# Patient Record
Sex: Male | Born: 1970 | Race: White | Hispanic: No | Marital: Married | Smoking: Never smoker
Health system: Southern US, Community
[De-identification: ages and names within clinical notes are randomized; demographics above are authoritative.]

## PROBLEM LIST (undated history)

## (undated) DIAGNOSIS — Z148 Genetic carrier of other disease: Secondary | ICD-10-CM

## (undated) DIAGNOSIS — D447 Neoplasm of uncertain behavior of aortic body and other paraganglia: Secondary | ICD-10-CM

## (undated) HISTORY — DX: Genetic carrier of other disease: Z14.8

## (undated) HISTORY — DX: Neoplasm of uncertain behavior of aortic body and other paraganglia: D44.7

---

## 2016-03-22 ENCOUNTER — Other Ambulatory Visit: Payer: Self-pay | Admitting: Internal Medicine

## 2016-03-22 DIAGNOSIS — E784 Other hyperlipidemia: Secondary | ICD-10-CM | POA: Diagnosis not present

## 2016-03-22 DIAGNOSIS — D447 Neoplasm of uncertain behavior of aortic body and other paraganglia: Secondary | ICD-10-CM

## 2016-03-22 DIAGNOSIS — Z6824 Body mass index (BMI) 24.0-24.9, adult: Secondary | ICD-10-CM | POA: Diagnosis not present

## 2016-03-24 ENCOUNTER — Telehealth: Payer: Self-pay | Admitting: Genetic Counselor

## 2016-03-24 ENCOUNTER — Encounter: Payer: Self-pay | Admitting: Genetic Counselor

## 2016-03-24 NOTE — Telephone Encounter (Signed)
Verified address and insurance, faxed referring provider, mailed new pt packet, completed intake, pt confirmed appt

## 2016-03-28 ENCOUNTER — Ambulatory Visit
Admission: RE | Admit: 2016-03-28 | Discharge: 2016-03-28 | Disposition: A | Discharge status: Home / Self Care | Payer: BLUE CROSS/BLUE SHIELD | Source: Ambulatory Visit | Attending: Internal Medicine | Admitting: Internal Medicine

## 2016-03-28 DIAGNOSIS — D447 Neoplasm of uncertain behavior of aortic body and other paraganglia: Secondary | ICD-10-CM

## 2016-03-28 DIAGNOSIS — G9389 Other specified disorders of brain: Secondary | ICD-10-CM | POA: Diagnosis not present

## 2016-03-28 DIAGNOSIS — R938 Abnormal findings on diagnostic imaging of other specified body structures: Secondary | ICD-10-CM | POA: Diagnosis not present

## 2016-03-28 MED ORDER — IOPAMIDOL (ISOVUE-300) INJECTION 61%
75.0000 mL | Freq: Once | INTRAVENOUS | Status: AC | PRN
  Administered 2016-03-28: 75 mL via INTRAVENOUS

## 2016-05-05 ENCOUNTER — Telehealth: Payer: Self-pay | Admitting: Genetic Counselor

## 2016-05-05 NOTE — Telephone Encounter (Signed)
Contacted referring office to refax the referral packet for records regarding appt for 06/05

## 2016-05-08 ENCOUNTER — Encounter: Payer: Self-pay | Admitting: Genetic Counselor

## 2016-05-08 ENCOUNTER — Other Ambulatory Visit: Payer: BLUE CROSS/BLUE SHIELD

## 2016-05-08 ENCOUNTER — Ambulatory Visit (HOSPITAL_BASED_OUTPATIENT_CLINIC_OR_DEPARTMENT_OTHER): Payer: BLUE CROSS/BLUE SHIELD | Admitting: Genetic Counselor

## 2016-05-08 DIAGNOSIS — D447 Neoplasm of uncertain behavior of aortic body and other paraganglia: Secondary | ICD-10-CM

## 2016-05-08 DIAGNOSIS — Z315 Encounter for genetic counseling: Secondary | ICD-10-CM

## 2016-05-08 DIAGNOSIS — Z148 Genetic carrier of other disease: Secondary | ICD-10-CM | POA: Diagnosis not present

## 2016-05-08 NOTE — Progress Notes (Signed)
REFERRING PROVIDER: Velna Hatchet, MD [place], [place] [address]  PRIMARY PROVIDER:  No primary care provider on file.  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 male, was seen for a [facility] cancer genetics consultation at the request of Dr. Ardeth Pruitt due to a personal and family history of cancer.  Mr. John Pruitt presents to clinic today to discuss the possibility of a hereditary predisposition to cancer, genetic testing, and to further clarify his future cancer risks, as well as potential cancer risks for family members.   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  This was treated with surgery in [place], [place], and his hearing did not recover. Within weeks to months another lump came up near the site of incision.  This was thought to be scar tissue. 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  It is unclear if this was a new PGL or if it was a remnant of the original PGL.  Mr. John Pruitt recently had a CT scan to look for additional PGLs and it was negative.   CANCER HISTORY:   No history exists.     RISK FACTORS:  Colonoscopy: yes; cannot remember why he had a colonoscopy but it was clear, no polyps. Annual physicals, unclear if he has had prostate cancer screening Non-smoker Alcohol use: 15-20 beers/week  Past Medical History  Diagnosis Date  . 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 Hemochromatosis carrier     History reviewed. No pertinent past surgical history.  Social History   Social History  . Marital Status: Married    Spouse Name: John Pruitt  . Number of Children: 2  . Years of Education: N/A   Social History Main Topics  . Smoking status: Never Smoker   . Smokeless tobacco: None  . Alcohol Use: Yes      Comment: 15-20 beers/week  . Drug Use: None  . Sexual Activity: Not Asked   Other Topics Concern  . None   Social History Narrative  . None     FAMILY HISTORY:  We obtained a detailed, 4-generation family history.  Significant diagnoses are listed below: Family History  Problem Relation Age of Onset  . Hemochromatosis Father   . Cancer Maternal Uncle     NOS  . Pneumonia Maternal Grandmother   . Asthma Maternal Grandfather   . Bleeding Disorder Brother     "very mild hemophilia - never has caused him problems"  . Testicular cancer Paternal Uncle     diagnosed in mid-life    The patient has two children, ages 30 and 53, who are healthy and cancer free.  He has two brothers, one who has a bleeding disorder described as a very mild hemophilia that has never caused him any issues.  The patient's mother is alive at age 67.  His father died of complications of untreated Hemochromatosis and possible liver cancer at age 31.  His father had one brother and one sister.  His brother had testicular cancer in his mid life and died in his early 20s.  The patient's paternal grandparents are both deceased from causes other than cancer. The patient's mother has two sisters and a brother.  The brother,  John Pruitt, has some form of cancer.  His maternal grandparents are both deceased, his grandmother possibly from pneumonia and his grandfather from complications of asthma.   Patient's maternal ancestors are of [place] descent, and paternal ancestors are of [place] descent. There is no reported Ashkenazi Jewish ancestry. There is no known consanguinity.  GENETIC COUNSELING ASSESSMENT: John Pruitt is a 45 y.o. male with a personal history of paraganglioma and family history of hemochromatosis, a mild bleeding disorder, liver and testicular cancer and an unknown cancer which is somewhat suggestive of a hereditary cancer syndrome and predisposition to cancer. We, therefore, discussed and recommended the  following at today's visit.   DISCUSSION: We discussed that about 1/3 of paraganglioma's (PGL) are hereditary.  Most PGLs are benign, but about 25% can be malignant.  They can cause a variety of issues, depending if they are secreting or not, including increased blood pressure or increased sweating, or they can press on nerves and cause ringing in the ears and hearing loss.  Another type of tumor that is similar to PGL is a pheochromocytoma (Pheo).  Pheo's are located in the adrenal glands.  There are approximately 12 genes that have been associated with PGLs, and sometimes are associated with other cancers, most commonly kidney or thyroid cancer or GIST tumors.  We reviewed the characteristics, features and inheritance patterns of hereditary cancer syndromes. We also discussed genetic testing, including the appropriate family members to test, the process of testing, insurance coverage and turn-around-time for results. We discussed the implications of a negative, positive and/or variant of uncertain significant result. We recommended Mr. Whitecotton pursue genetic testing for the Paragangliom/Pheochromocytoma gene panel. The Paraganglioma/Pheochromocytoma gene panel offered by [facility] includes sequencing and rearrangement analysis for the following 12 genes: FH, MEN1, MAX, NF1, RET, SDHA, SDHAF2, SDHB, SDHC, SDHD, TMEM127 and VHL.   Based on Mr. Eguia personal and family history of cancer, he meets medical criteria for genetic testing. Despite that he meets criteria, he may still have an out of pocket cost. We discussed that if his out of pocket cost for testing is over $100, the laboratory will call and confirm whether he wants to proceed with testing.  If the out of pocket cost of testing is less than $100 he will be billed by the genetic testing laboratory.   We discussed Hemochromatosis in that it is typically a recessive condition.  Presenting symptoms include non-specific joint pain, fatigue and  abdominal pain.  There are two mutations that make up the majority of cases, and individuals who are affected can either be homozygous for the mutation or be a compound heterozygote.  Mr. Weyenberg states that he was tested and is not affected.  On further questioning he is not clear if he had genetic testing or if he was tested for iron levels. We discussed that he may want to consider genetic testing, and possibly have his wife tested since this is a recessive condition.  If Mr. Swatzell father truly had hemochromatosis, then Mr. Neumeister is definitely a carrier.  If his wife is a carrier then their children would be at risk for having the condition.  Not everyone who is affected will need treatment, but it would be important to follow over time.  Lastly, we discussed the possibly "hemophilia" in Mr. Lauchlin's brother.  He reports that his brother has hemophilia, but is unsure if he has type A or type B.  On further questioning, his brother has not needed to refrain from participating in sports,  he has not needed to receive "factor" over time, and this condition has not affected his life all that much.  We discussed that he could actually have a different condition called Von Willibrand disease, rather than hemophilia.  Mr. Malen Gauze will ask his brother more about his condition.  PLAN: After considering the risks, benefits, and limitations, Mr. Remo  provided informed consent to pursue genetic testing and the blood sample was sent to [facility] for analysis of the PGL/Pheo genetic panel. Results should be available within approximately 2-3 weeks' time, at which point they will be disclosed by telephone to Mr. Etchison, as will any additional recommendations warranted by these results. Mr. Bohanon will receive a summary of his genetic counseling visit and a copy of his results once available. This information will also be available in Epic. We encouraged Mr. Tigges to remain in contact with  cancer genetics annually so that we can continuously update the family history and inform him of any changes in cancer genetics and testing that may be of benefit for his family. Mr. Reigle questions were answered to his satisfaction today. Our contact information was provided should additional questions or concerns arise.  Lastly, we encouraged Mr. Fullilove to remain in contact with cancer genetics annually so that we can continuously update the family history and inform him of any changes in cancer genetics and testing that may be of benefit for this family.   Mr.  Bogosian questions were answered to his satisfaction today. Our contact information was provided should additional questions or concerns arise. Thank you for the referral and allowing [place] to share in the care of your patient.   Karen P. Florene Glen, [place], [facility] Certified Genetic Counselor [email or web address] phone: [phone]  The patient was seen for a total of 50 minutes in face-to-face genetic counseling.  This patient was discussed with Drs. Magrinat, Lindi Adie and/or Burr Medico who agrees with the above.    _______________________________________________________________________ For Office Staff:  Number of people involved in session: 2 Was an Intern/ student involved with case: no

## 2016-06-14 DIAGNOSIS — D447 Neoplasm of uncertain behavior of aortic body and other paraganglia: Secondary | ICD-10-CM | POA: Diagnosis not present

## 2016-06-14 DIAGNOSIS — Z148 Genetic carrier of other disease: Secondary | ICD-10-CM | POA: Diagnosis not present

## 2016-06-26 ENCOUNTER — Telehealth: Payer: Self-pay | Admitting: Genetic Counselor

## 2016-06-26 NOTE — Telephone Encounter (Signed)
PM on VM that I called from [facility].  ASked that he please call back for test results.  Left CB number.

## 2016-06-26 NOTE — Telephone Encounter (Signed)
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  Explained that this lab has seen this only once - his result.  Therefore I called two other laboratories to see how they classify this change.  One other lab has also only seen this once and classifies this as a VUS.  The other has seen it 11 times and classifies it as a likely pathogenic.  Discussed that this complicates things as reputable labs have different classifications, but the one that has seen it the most says that it is disease causing.  Therefore we should follow him based on his clinical presentation, and consider genetic testing on relatives.  Offered patient an appointment on Wed at 85 or 3.  He will check with his wife and call back.

## 2016-06-27 ENCOUNTER — Encounter: Payer: Self-pay | Admitting: Genetic Counselor

## 2016-06-27 DIAGNOSIS — Z1379 Encounter for other screening for genetic and chromosomal anomalies: Secondary | ICD-10-CM | POA: Insufficient documentation

## 2016-07-07 ENCOUNTER — Encounter: Payer: Self-pay | Admitting: Genetic Counselor

## 2016-07-07 ENCOUNTER — Ambulatory Visit: Payer: BLUE CROSS/BLUE SHIELD | Admitting: Genetic Counselor

## 2016-07-07 DIAGNOSIS — D447 Neoplasm of uncertain behavior of aortic body and other paraganglia: Secondary | ICD-10-CM

## 2016-07-07 DIAGNOSIS — Z1379 Encounter for other screening for genetic and chromosomal anomalies: Secondary | ICD-10-CM

## 2016-07-07 NOTE — Progress Notes (Signed)
REFERRING PROVIDER: Velna Hatchet, MD [place], [place] [address]  PRIMARY PROVIDER:  Velna Hatchet, MD [place], [place] [address]  PRIMARY REASON FOR VISIT:  1. Genetic testing   2. 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 Paraganglioma ([place])     HPI: John Pruitt was previously seen in the [facility] due to a personal history of a paraganglioma and concerns regarding a hereditary predisposition to cancer. Please refer to our previous genetics note for more information about his personal and family history and medical information.  John Pruitt recent genetic test results were disclosed to him, as were recommendations warranted by these results. These results and recommendations are discussed in more detail below.  FAMILY HISTORY:  We obtained a detailed, 4-generation family history.  Significant diagnoses are listed below: Family History  Problem Relation Age of Onset  . Hemochromatosis Father   . Cancer Maternal Uncle     NOS  . Pneumonia Maternal Grandmother   . Asthma Maternal Grandfather   . Bleeding Disorder Brother     "very mild hemophilia - never has caused him problems"  . Testicular cancer Paternal Uncle     diagnosed in mid-life    The patient has two children, ages 59 and 64, who are healthy and cancer free.  He has two brothers, one who has a bleeding disorder described as a very mild hemophilia that has never caused him any issues.  The patient's mother is alive at age 65.  His father died of complications of untreated Hemochromatosis and possible liver cancer at age 42.  His father had one brother and one sister.  His brother had testicular cancer in his mid life and died in his early 66s.  The patient's paternal grandparents are both deceased from causes other than cancer. The patient's mother has two sisters and a brother.  The brother, Aaron Edelman, has some form of cancer.  His maternal  grandparents are both deceased, his grandmother possibly from pneumonia and his grandfather from complications of asthma.   Patient's maternal ancestors are of [place] descent, and paternal ancestors are of [place] descent. There is no reported Ashkenazi Jewish ancestry. There is no known consanguinity.  GENETIC TEST RESULTS: At the time of John Pruitt visit, we recommended she pursue genetic testing of the Paraganglioma/Pheochromocytoma genetic testing panel. The Paraganglioma/Pheochromocytoma gene panel offered by [facility] includes sequencing and rearrangement analysis for the following 12 genes: FH, MEN1, MAX, NF1, RET, SDHA, SDHAF2, SDHB, SDHC, SDHD, TMEM127 and VHL.  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  This VUS was reviewed with [facility].  The patient was the first person in which they identified this variant through their PGL/PCC gene test.  Calls to other laboratories identified that John Pruitt now calls this a Likely Pathogenic variant based on internal data and utilizing ABGC variant classifications.  [facility] has reviewed their Genomic data, and along with their discussions with Cephus Shelling are now relooking at this variant.  They will let us know if they reclassify this genetic change to a likely pathogenic variant.  A copy of the test report has been scanned into Epic for review.   At this time, based on the patient's clinical presentation, [facility]'s test result, and another laboratory determining this variant as likely pathogenic due to internal data (11 times seen, 4 patients affected) and ABGC variant classification, we feel that the patient should be followed through a paraganglioma protocol.  Clinical condition Single pathogenic variants in the [facility] gene are associated with the development of paragangliomas (PGL) and pheochromocytomas [facility]) as seen in hereditary paraganglioma-pheochromocytoma syndrome  ( 88325498, 26415830, 94076808, 81103159), gastrointestinal stromal tumors (GIST) ( 45859292, 44628638, 17711657), Carney-Stratakis syndrome ( 90383338, 32919166, 06004599, 77414239, 53202334, 35686168), and renal cell carcinoma (PMID: 37290211, 15520802, 23361224, 49753005). It is unclear if pathogenic SDHC variants are associated with other cancers as the data are currently limited and emerging. The clinical presentation is highly variable among those with a pathogenic variant in [facility] and may be difficult to predict. An individual with a [place] pathogenic variant will not necessarily develop cancer in their lifetime, but the risk for cancer is increased over that of the general population.  These changes can be inherited, or they could occur as a de novo (new) pathogenic change.  PGL/PCC Paragangliomas (PGL) are rare, typically benign, neuroendocrine tumors that arise from paraganglia. Paraganglia are a collection of neuroendocrine tissues that are distributed throughout the body, from the middle ear and skull base to the pelvis. PGLs that develop in the head and neck are called head-and-neck paragangliomas (HNP). PGLs located outside the head and neck most commonly occur in the adrenal glands and are called pheochromocytomas [facility]), also known as chromaffin tumors. PCCs are catecholamine-secreting PGLs that are confined to the adrenal medulla, as defined by the The World Health Organization Tumor Classification; however, this term may also be used to refer to catecholamine-producing PGLs regardless of whether they are adrenal or extra-adrenal (PMID: 11021117). These lesions can cause excessive production of adrenal hormones, resulting in hypertension, headaches, tachycardia, anxiety, and sweaty or clammy skin in some individuals (PMID: 35670141, 03013143, 88875797). Most cases of PGL and [place] are sporadic, but approximately one-third are familial and due to an identifiable pathogenic variant in a susceptibility  gene, such as [place], that can result in hereditary paraganglioma-pheochromocytoma syndrome (PMID: 28206015, 61537943, 27614709).  Individuals with SDHC pathogenic variants are at risk to develop solitary, benign, parasympathetic HNPs, although adrenal [facility] and extra-adrenal PGL have also been reported (PMID: 29574734). Literature suggests the mean age of tumor presentation is approximately 16 years (PMID: 03709643, 83818403, 75436067, 70340352) and malignant transformation is rare (PMID: 48185909, 31121624, 46950722). It has been suggested that [facility] pathogenic variants may be associated with pituitary adenomas and pancreatic neuroendocrine tumors, however, the evidence is preliminary and emerging (PMID: 57505183, 35825189, 84210312, 81188677). Because SDHC pathogenic variants are rare, data on the clinical characteristics associated with this gene are currently limited (PMID: 37366815).  GIST Gastrointestinal stromal tumors (GIST) are rare, typically adult-onset sarcomas that may be either benign or malignant. They can occur sporadically or due to a heritable pathogenic variant in a susceptibility gene, such as [facility] (PMID: 94707615). GIST can develop anywhere along the GI tract, which includes the esophagus, stomach, gallbladder, liver, small intestine, colon, rectum, anus, and lining of the gut. GISTs arise from a specific cell type, interstitial cells of [place] (ICC), which line the walls of the GI tract. More than half of GISTs start in the stomach. The next-largest proportion of cases start in the small intestine, followed by the omentum and peritoneum (PMID: 18343735, 78978478, 41282081).  Carney-Stratakis syndrome Carney-Stratakis syndrome (CSS) is an autosomal dominant condition that can be caused by pathogenic variants in [facility]. This rare condition is characterized by the development of PGL, GIST, or both (PMID: 38871959, 74718550, 15868257, 49355217, 47159539, 67289791). CSS is very similar to but  distinctly different from [place] triad, a typically sporadic condition associated with pulmonary  chondromas, GIST, and PGL (PMID: 47425956, 38756433).  Inheritance Pathogenic variants in the [facility] gene are inherited in an autosomal dominant manner. This means that an individual with a pathogenic variant has a 50% chance of passing the condition on to their offspring. With this result, it is now possible to identify at-risk relatives who can pursue testing for this specific familial variant.   Many cases are inherited from a parent, but some cases can occur spontaneously (i.e., an individual with a pathogenic variant has parents who do not have it).  It is important that all of John Pruitt relatives (both men and women) know of the presence of this gene mutation. Site-specific genetic testing can sort out who in the family is at risk and who is not.   Mr. Valbuena children and siblings have a 50% chance to have inherited this mutation. We recommend they have genetic testing for this same mutation, as identifying the presence of this mutation would allow them to also take advantage of risk-reducing measures. Because screening recommendations start beginning at 44 years of age, or at least ten years before the earliest age of diagnosis, testing minor children is advised.  Referral information was provided to John Pruitt and his wife regarding for testing of their children.  John Pruitt reports having an appointment set up in the [facility] clinic on August 08, 2016.  We also discussed going to [facility] and being seen through the peds oncology program.  I will reach out the the [facility] Pediatric genetics program to determine if this is the best place to send the children.  If not, we will send them for genetic counseling with Linna Darner, MS, CGC at [facility] in [place].  Medical releases were signed today for both places.  Management It is suggested that  individuals with hereditary paraganglioma-pheochromocytoma syndrome, along with their at-risk relatives, have regular clinical monitoring by a physician or medical team with expertise in the treatment of hereditary GIST and PGL/PCC syndromes. A consultation with an endocrine surgeon, endocrinologist, and otolaryngologist is also recommended to establish an individualized care plan (PMID: 29518841).  Information about [facility] based endocrinologists was given to the Pruitt today.  Screening should begin between five and ten years of age, or at least ten years before the earliest age at diagnosis in the family (PMID: 66063016, 01093235, 57322025). Although there is currently no clear consensus regarding a screening and surveillance protocol for individuals with pathogenic SDHC variants, lifelong annual biochemical and clinical surveillance is suggested (PMID: 42706237, 62831517, 61607371, 06269485):   Physical exam and blood pressure at the time of diagnosis and then every 6-12 months  24-hour urinary excretion of fractionated metanephrines and catecholamines and/or plasma fractionated metanephrines at least annually to detect metastatic disease, tumor recurrence, or development of additional tumors.  Follow up with imaging by CT, MRI, 123I-MIBG (metaiodobenzylguanidine) scintigraphy, or FDG-PET if the fractionated metanephrine and/or catecholamine levels become elevated, or if the original tumor had minimal or no catecholamine/fractionated metanephrine excess  Imaging studies, including MRI/CT, I-MIBG, and possibly PET, approximately every 6-36 months of the skull base and neck  MRI or CT of the abdomen, thorax, and pelvis to detect PGL  In order to minimize radiation exposure, MRI may be the preferable imaging modality with CT and nuclear imaging reserved to further characterize detected tumors  Periodic (e.g., every 1-4 years) 123I-MIBG scintigraphy or PET to detect paragangliomas or  metastatic disease that may be undetectable by MRI or CT  Imaging modalities should be  at the discretion of the managing provider due to conflicting data regarding the utility and efficacy of the various options (PMID: 60737106, 26948546)  Evaluation of cases with extra-adrenal sympathetic PGL and [place] for blood pressure elevations, tachycardia, and other signs and symptoms of catecholamine hypersecretion  Consideration of evaluation for GISTs in individuals (especially children, adolescents, or young adults) who have unexplained gastrointestinal symptoms (e.g., abdominal pain, upper gastrointestinal bleeding, nausea, vomiting, difficulty swallowing) or who experience unexplained intestinal obstruction or anemia  Consideration of screening for renal cell carcinoma (PMID: 27035009)  Medical genetics consultation  Summary of medical management and surveillance recommendations (PMID: 38182993, 71696789, 3810175, 10258527):    It is important to note that individuals with a pathogenic SDHC variant may be at a greater risk of developing PGL or [place] when living in higher altitudes or when chronically exposed to hypoxic conditions. In general, inactivating mutations in one of the succinate dehydrogenase genes, which includes SDHC, leads to accumulation of succinate, the formation of reactive oxygen species, and the activation of hypoxia-dependent pathways (PMID: 78242353). Avoidance of living at high altitudes and activities that promote long-term exposure to hypoxia and predispose to chronic lung disease (e.g., smoking) is therefore encouraged (PMID: 61443154, 00867619).  The natural history of hereditary paraganglioma-pheochromocytoma syndrome is variable and continues to evolve. This can often result in significant uncertainty regarding long-term prognosis. In addition, the clinical manifestations of PGLs and PCCs are broad; many symptoms can mimic minor ailments such as headaches and palpitations.  Because of this reason, once a pathogenic variant has been identified, patients should be encouraged to have a low threshold for contacting their healthcare provider for further evaluation of unusual symptoms (PMID: (external) 24854530":http://www.ncbi.nlm.nih.gov/pubmed/24854530).  Awareness of this cancer predisposition can encourage patients and their providers to inform at-risk family members, to diligently follow condition-specific screening protocols, and to be vigilant in maintaining close and regular contact with their local genetics clinic in anticipation of new information.  An individual's cancer risk and medical management are not determined by genetic test results alone. Overall cancer risk assessment incorporates additional factors, including personal medical history, family history, and any available genetic information that may result in a personalized plan for cancer prevention and surveillance.  Knowing if a pathogenic variant in [facility] is present is advantageous. At-risk relatives can be identified, enabling pursuit of a diagnostic evaluation. Further, the available information regarding hereditary cancer susceptibility genes is constantly evolving and more clinically relevant data regarding [place] are likely to become available in the near future.   SUPPORT AND RESOURCES: Information was provided to John Pruitt about support groups.  If John Pruitt is interested in SDHx-specific information and support, there are two groups, [place] (www.pheopara.org) and [place] (www.pheoparatroopers.org) which some people have found useful. They provide opportunities to speak with other individuals from high-risk families. To locate genetic counselors in other cities, visit the website of the [employer] (ArtistMovie.se) and Secretary/administrator for a Social worker by zip code.  The family was also provided information about the [place] through [facility].  They may sign up for this registry online.  We encouraged John Pruitt to remain in contact with [place] on an annual basis so we can update his personal and family histories, and let him know of advances in cancer genetics that may benefit the family. John Pruitt reports that his job may move him around quite a bit, so we did discuss keeping [place] updated on his whereabouts so that we can  let him know if his test report is reclassified.  Our contact number was provided. John Pruitt questions were answered to his satisfaction today, and he knows he is welcome to call anytime with additional questions.   Mikelle Myrick P. Florene [place], [place], [facility] Certified Genetic Counselor [email or web address] phone: [phone]

## 2016-08-08 ENCOUNTER — Encounter: Payer: Self-pay | Admitting: Genetic Counselor

## 2016-08-08 ENCOUNTER — Telehealth: Payer: Self-pay | Admitting: Genetic Counselor

## 2016-08-08 NOTE — Telephone Encounter (Signed)
Explained that his genetic testing was updated from a VUS to a likely pathogenic variant.  This was to be expected based on previous conversations between myself and the laboratory.  We will send him an updated report.

## 2016-08-08 NOTE — Telephone Encounter (Signed)
LM on VM that I called about an updated report from his genetic testing.  Asked that he please call back.  Gave CB instructions.

## 2016-08-21 ENCOUNTER — Other Ambulatory Visit: Payer: Self-pay | Admitting: Internal Medicine

## 2016-08-21 DIAGNOSIS — D447 Neoplasm of uncertain behavior of aortic body and other paraganglia: Secondary | ICD-10-CM

## 2016-08-29 ENCOUNTER — Ambulatory Visit
Admission: RE | Admit: 2016-08-29 | Discharge: 2016-08-29 | Disposition: A | Discharge status: Home / Self Care | Payer: BLUE CROSS/BLUE SHIELD | Source: Ambulatory Visit | Attending: Internal Medicine | Admitting: Internal Medicine

## 2016-08-29 DIAGNOSIS — D447 Neoplasm of uncertain behavior of aortic body and other paraganglia: Secondary | ICD-10-CM

## 2016-08-29 DIAGNOSIS — R918 Other nonspecific abnormal finding of lung field: Secondary | ICD-10-CM | POA: Diagnosis not present

## 2016-08-29 DIAGNOSIS — K7689 Other specified diseases of liver: Secondary | ICD-10-CM | POA: Diagnosis not present

## 2016-08-29 MED ORDER — IOPAMIDOL (ISOVUE-300) INJECTION 61%
100.0000 mL | Freq: Once | INTRAVENOUS | Status: AC | PRN
  Administered 2016-08-29: 100 mL via INTRAVENOUS

## 2016-08-30 DIAGNOSIS — D447 Neoplasm of uncertain behavior of aortic body and other paraganglia: Secondary | ICD-10-CM | POA: Diagnosis not present

## 2016-11-23 DIAGNOSIS — Z0000 Encounter for general adult medical examination without abnormal findings: Secondary | ICD-10-CM | POA: Diagnosis not present

## 2016-11-23 DIAGNOSIS — Z125 Encounter for screening for malignant neoplasm of prostate: Secondary | ICD-10-CM | POA: Diagnosis not present

## 2016-11-30 DIAGNOSIS — D447 Neoplasm of uncertain behavior of aortic body and other paraganglia: Secondary | ICD-10-CM | POA: Diagnosis not present

## 2016-11-30 DIAGNOSIS — Z0000 Encounter for general adult medical examination without abnormal findings: Secondary | ICD-10-CM | POA: Diagnosis not present

## 2016-11-30 DIAGNOSIS — Z6825 Body mass index (BMI) 25.0-25.9, adult: Secondary | ICD-10-CM | POA: Diagnosis not present

## 2016-11-30 DIAGNOSIS — E784 Other hyperlipidemia: Secondary | ICD-10-CM | POA: Diagnosis not present

## 2016-11-30 DIAGNOSIS — Z1389 Encounter for screening for other disorder: Secondary | ICD-10-CM | POA: Diagnosis not present

## 2017-02-24 ENCOUNTER — Encounter (HOSPITAL_COMMUNITY): Payer: Self-pay

## 2017-04-27 DIAGNOSIS — R0789 Other chest pain: Secondary | ICD-10-CM | POA: Diagnosis not present

## 2017-04-27 DIAGNOSIS — R05 Cough: Secondary | ICD-10-CM | POA: Diagnosis not present

## 2017-04-27 DIAGNOSIS — Z6824 Body mass index (BMI) 24.0-24.9, adult: Secondary | ICD-10-CM | POA: Diagnosis not present

## 2017-06-13 DIAGNOSIS — Z0184 Encounter for antibody response examination: Secondary | ICD-10-CM | POA: Diagnosis not present

## 2017-12-07 DIAGNOSIS — Z125 Encounter for screening for malignant neoplasm of prostate: Secondary | ICD-10-CM | POA: Diagnosis not present

## 2017-12-07 DIAGNOSIS — E7849 Other hyperlipidemia: Secondary | ICD-10-CM | POA: Diagnosis not present

## 2017-12-14 DIAGNOSIS — D447 Neoplasm of uncertain behavior of aortic body and other paraganglia: Secondary | ICD-10-CM | POA: Diagnosis not present

## 2017-12-14 DIAGNOSIS — Z23 Encounter for immunization: Secondary | ICD-10-CM | POA: Diagnosis not present

## 2017-12-14 DIAGNOSIS — Z0000 Encounter for general adult medical examination without abnormal findings: Secondary | ICD-10-CM | POA: Diagnosis not present

## 2017-12-14 DIAGNOSIS — Z1389 Encounter for screening for other disorder: Secondary | ICD-10-CM | POA: Diagnosis not present

## 2017-12-14 DIAGNOSIS — E7849 Other hyperlipidemia: Secondary | ICD-10-CM | POA: Diagnosis not present

## 2017-12-18 DIAGNOSIS — Z1212 Encounter for screening for malignant neoplasm of rectum: Secondary | ICD-10-CM | POA: Diagnosis not present

## 2017-12-22 ENCOUNTER — Other Ambulatory Visit: Payer: Self-pay | Admitting: Internal Medicine

## 2017-12-22 DIAGNOSIS — D447 Neoplasm of uncertain behavior of aortic body and other paraganglia: Secondary | ICD-10-CM

## 2018-01-01 DIAGNOSIS — D3500 Benign neoplasm of unspecified adrenal gland: Secondary | ICD-10-CM | POA: Diagnosis not present

## 2018-01-04 ENCOUNTER — Ambulatory Visit
Admission: RE | Admit: 2018-01-04 | Discharge: 2018-01-04 | Disposition: A | Discharge status: Home / Self Care | Payer: BLUE CROSS/BLUE SHIELD | Source: Ambulatory Visit | Attending: Internal Medicine | Admitting: Internal Medicine

## 2018-01-04 ENCOUNTER — Other Ambulatory Visit: Payer: BLUE CROSS/BLUE SHIELD

## 2018-01-04 DIAGNOSIS — D447 Neoplasm of uncertain behavior of aortic body and other paraganglia: Secondary | ICD-10-CM | POA: Diagnosis not present

## 2018-01-04 MED ORDER — IOPAMIDOL (ISOVUE-300) INJECTION 61%
75.0000 mL | Freq: Once | INTRAVENOUS | Status: AC | PRN
  Administered 2018-01-04: 75 mL via INTRAVENOUS

## 2018-05-24 DIAGNOSIS — Z131 Encounter for screening for diabetes mellitus: Secondary | ICD-10-CM | POA: Diagnosis not present

## 2018-05-30 DIAGNOSIS — Z3009 Encounter for other general counseling and advice on contraception: Secondary | ICD-10-CM | POA: Diagnosis not present

## 2018-10-11 DIAGNOSIS — Z302 Encounter for sterilization: Secondary | ICD-10-CM | POA: Diagnosis not present

## 2018-12-16 DIAGNOSIS — E7849 Other hyperlipidemia: Secondary | ICD-10-CM | POA: Diagnosis not present

## 2018-12-16 DIAGNOSIS — Z125 Encounter for screening for malignant neoplasm of prostate: Secondary | ICD-10-CM | POA: Diagnosis not present

## 2018-12-16 DIAGNOSIS — Z0000 Encounter for general adult medical examination without abnormal findings: Secondary | ICD-10-CM | POA: Diagnosis not present

## 2018-12-20 DIAGNOSIS — Z23 Encounter for immunization: Secondary | ICD-10-CM | POA: Diagnosis not present

## 2018-12-20 DIAGNOSIS — E7849 Other hyperlipidemia: Secondary | ICD-10-CM | POA: Diagnosis not present

## 2018-12-20 DIAGNOSIS — D3500 Benign neoplasm of unspecified adrenal gland: Secondary | ICD-10-CM | POA: Diagnosis not present

## 2018-12-20 DIAGNOSIS — Z0000 Encounter for general adult medical examination without abnormal findings: Secondary | ICD-10-CM | POA: Diagnosis not present

## 2018-12-20 DIAGNOSIS — Z1331 Encounter for screening for depression: Secondary | ICD-10-CM | POA: Diagnosis not present

## 2019-04-07 IMAGING — CT CT HEAD WO/W CM
2 of 11 series · 7 of 33 positions shown, 8 images · IV contrast (75CC ISOVUE 300)
Comparison: 03/28/2016.

CLINICAL DATA: Continued surveillance LEFT skull base
paraganglioma.

EXAM:
CT HEAD WITHOUT AND WITH CONTRAST
TECHNIQUE: Contiguous axial images were obtained from the base of the skull
through the vertex without and with intravenous contrast
CONTRAST:  75mL [id number] IOPAMIDOL ([id number]) INJECTION 61%

[Series 602: sagittal brain · sagittal · 0.49mm/px · 5 of 55 slices shown]
[im 10/55  bone]
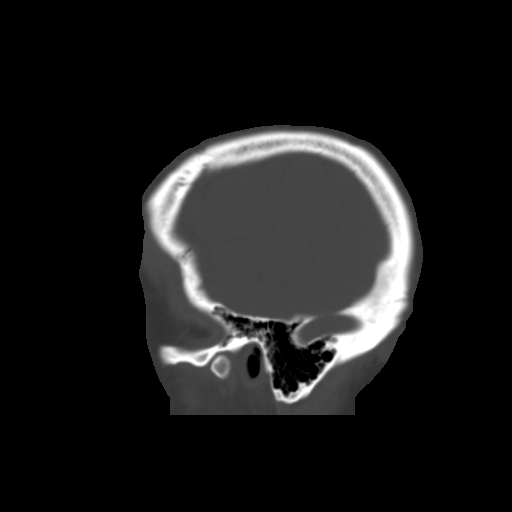
[im 19/55  bone]
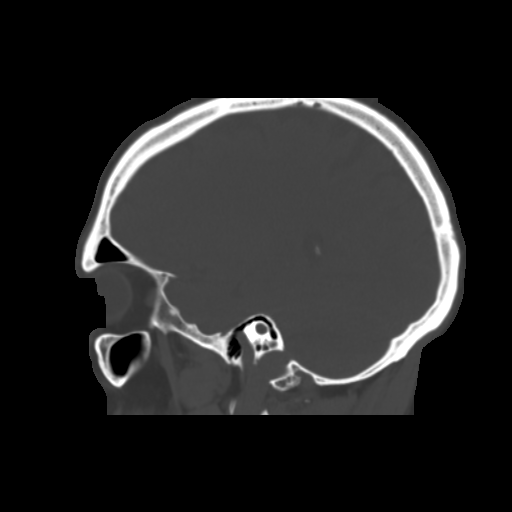
[im 28/55  bone]
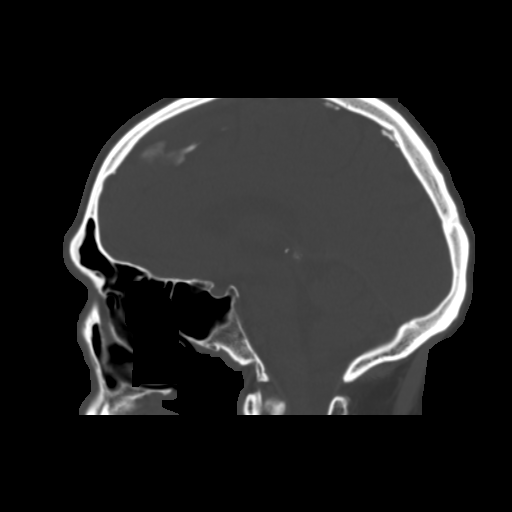
[im 37/55  bone]
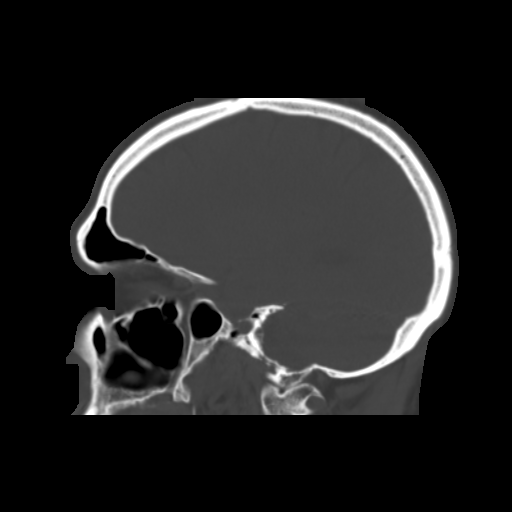
[im 46/55  bone]
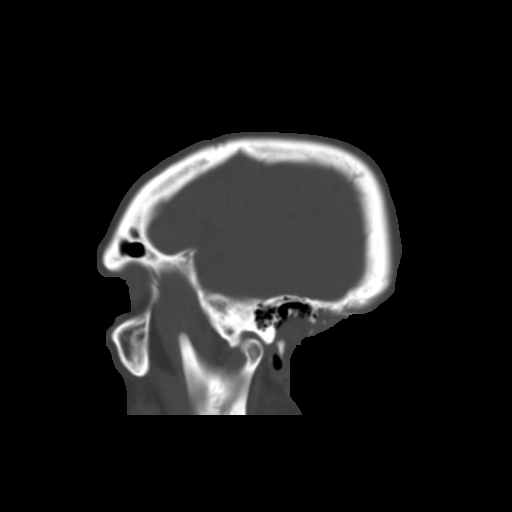

[Series 603: axial · axial · 0.54mm/px · z∈[+96,+196]mm · 2 of 153 slices shown, 3 images]
[im 51/153  soft-tissue]
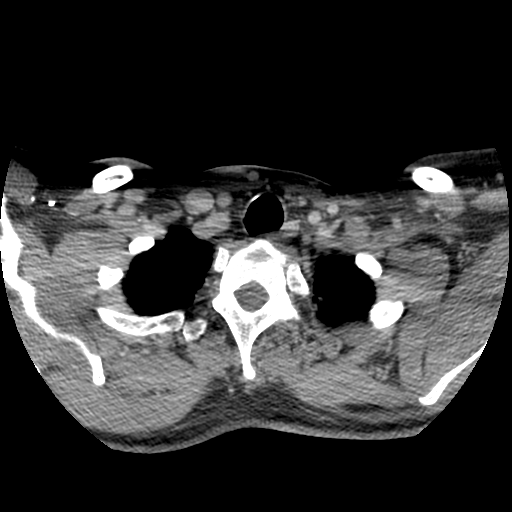
[im 51/153  bone]
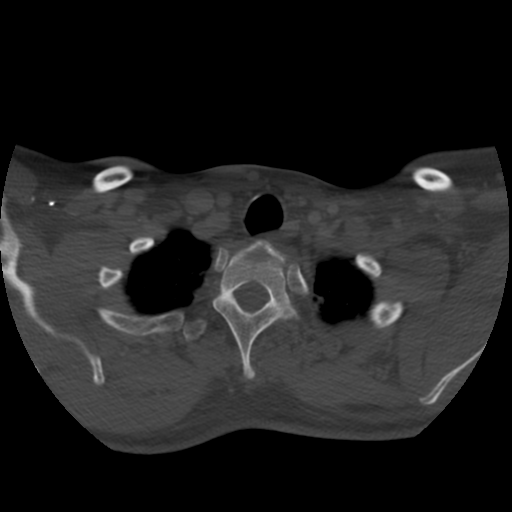
[im 102/153  bone]
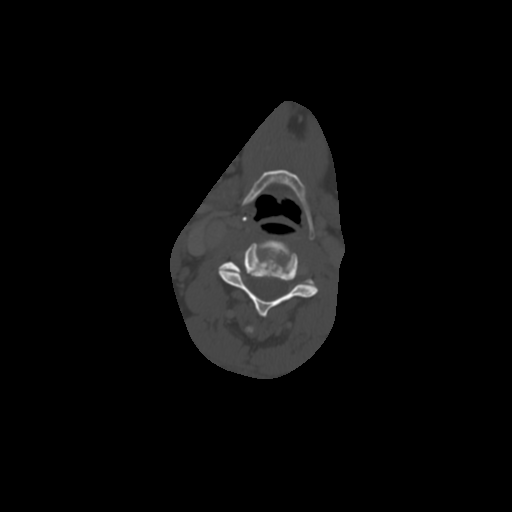

[7 of 33 positions shown; findings below may reference images not displayed]

FINDINGS: Brain: No evidence for acute infarction, hemorrhage, mass lesion,
hydrocephalus, or extra-axial fluid. Normal cerebral volume, except
for surgical encephalomalacia of the LEFT cerebellum. No white
matter disease. Post infusion, no abnormal enhancement.

There appears to be an aneurysm clip in the region of what would be
the junction of the LEFT transverse and sigmoid sinus. This clip
appears stable.

Patient is status post LEFT retromastoid/suboccipital craniectomy
for paraganglioma. No recurrent tumor is seen at the skull
base/epidural space.

Vascular: No hyperdense vessel or unexpected calcification. Visible
vessels are patent.

Skull: Negative for fracture or focal lesion. Postsurgical changes
on the LEFT are stable.

Sinuses/Orbits: No acute finding.

Other: None.
IMPRESSION: No evidence for recurrent tumor status post LEFT
retromastoid/suboccipital craniectomy for paraganglioma. No abnormal
enhancement. Stable appearance from 6823.

## 2019-06-11 DIAGNOSIS — J069 Acute upper respiratory infection, unspecified: Secondary | ICD-10-CM | POA: Diagnosis not present

## 2019-06-11 DIAGNOSIS — R05 Cough: Secondary | ICD-10-CM | POA: Diagnosis not present

## 2019-06-11 DIAGNOSIS — Z20818 Contact with and (suspected) exposure to other bacterial communicable diseases: Secondary | ICD-10-CM | POA: Diagnosis not present

## 2019-07-11 DIAGNOSIS — K219 Gastro-esophageal reflux disease without esophagitis: Secondary | ICD-10-CM | POA: Diagnosis not present

## 2019-07-11 DIAGNOSIS — R05 Cough: Secondary | ICD-10-CM | POA: Diagnosis not present

## 2019-07-11 DIAGNOSIS — R5383 Other fatigue: Secondary | ICD-10-CM | POA: Diagnosis not present

## 2019-07-11 DIAGNOSIS — Z20818 Contact with and (suspected) exposure to other bacterial communicable diseases: Secondary | ICD-10-CM | POA: Diagnosis not present

## 2019-08-16 DIAGNOSIS — Z23 Encounter for immunization: Secondary | ICD-10-CM | POA: Diagnosis not present

## 2023-08-05 DIAGNOSIS — H00024 Hordeolum internum left upper eyelid: Secondary | ICD-10-CM | POA: Diagnosis not present

## 2023-08-05 DIAGNOSIS — H00019 Hordeolum externum unspecified eye, unspecified eyelid: Secondary | ICD-10-CM | POA: Diagnosis not present

## 2023-12-28 DIAGNOSIS — Z1339 Encounter for screening examination for other mental health and behavioral disorders: Secondary | ICD-10-CM | POA: Diagnosis not present

## 2023-12-28 DIAGNOSIS — Z23 Encounter for immunization: Secondary | ICD-10-CM | POA: Diagnosis not present

## 2023-12-28 DIAGNOSIS — E785 Hyperlipidemia, unspecified: Secondary | ICD-10-CM | POA: Diagnosis not present

## 2023-12-28 DIAGNOSIS — Z0000 Encounter for general adult medical examination without abnormal findings: Secondary | ICD-10-CM | POA: Diagnosis not present

## 2023-12-28 DIAGNOSIS — Z1331 Encounter for screening for depression: Secondary | ICD-10-CM | POA: Diagnosis not present
